# Patient Record
Sex: Female | Born: 1995 | Race: Black or African American | Hispanic: No | Marital: Single | Smoking: Current every day smoker
Health system: Southern US, Community
[De-identification: ages and names within clinical notes are randomized; demographics above are authoritative.]

---
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
## 2017-07-20 ENCOUNTER — Encounter (HOSPITAL_COMMUNITY): Payer: Self-pay | Admitting: Emergency Medicine

## 2017-07-20 ENCOUNTER — Other Ambulatory Visit: Payer: Self-pay

## 2017-07-20 ENCOUNTER — Emergency Department (HOSPITAL_COMMUNITY)
Admission: EM | Admit: 2017-07-20 | Discharge: 2017-07-20 | Disposition: A | Discharge status: Home / Self Care | Payer: Self-pay | Attending: Emergency Medicine | Admitting: Emergency Medicine

## 2017-07-20 DIAGNOSIS — R1032 Left lower quadrant pain: Secondary | ICD-10-CM | POA: Insufficient documentation

## 2017-07-20 DIAGNOSIS — Z5321 Procedure and treatment not carried out due to patient leaving prior to being seen by health care provider: Secondary | ICD-10-CM | POA: Insufficient documentation

## 2017-07-20 NOTE — ED Triage Notes (Signed)
Pt verbalizes "bump" and pain to left groin for past week; denies pain at present. Denies vaginal symptoms.

## 2017-07-23 NOTE — ED Notes (Signed)
07/23/2017, Attempted to do a follow-up call, no answer.

## 2017-08-08 ENCOUNTER — Emergency Department (HOSPITAL_COMMUNITY)
Admission: EM | Admit: 2017-08-08 | Discharge: 2017-08-08 | Disposition: A | Discharge status: Home / Self Care | Payer: Self-pay | Attending: Emergency Medicine | Admitting: Emergency Medicine

## 2017-08-08 ENCOUNTER — Encounter (HOSPITAL_COMMUNITY): Payer: Self-pay | Admitting: Emergency Medicine

## 2017-08-08 DIAGNOSIS — F17210 Nicotine dependence, cigarettes, uncomplicated: Secondary | ICD-10-CM | POA: Insufficient documentation

## 2017-08-08 DIAGNOSIS — R102 Pelvic and perineal pain unspecified side: Secondary | ICD-10-CM

## 2017-08-08 DIAGNOSIS — N898 Other specified noninflammatory disorders of vagina: Secondary | ICD-10-CM | POA: Insufficient documentation

## 2017-08-08 LAB — COMPREHENSIVE METABOLIC PANEL
ALT: 13 U/L — ABNORMAL LOW (ref 14–54)
ANION GAP: 7 (ref 5–15)
AST: 17 U/L (ref 15–41)
Albumin: 3.9 g/dL (ref 3.5–5.0)
Alkaline Phosphatase: 59 U/L (ref 38–126)
BUN: 13 mg/dL (ref 6–20)
CO2: 26 mmol/L (ref 22–32)
Calcium: 9.2 mg/dL (ref 8.9–10.3)
Chloride: 107 mmol/L (ref 101–111)
Creatinine, Ser: 0.79 mg/dL (ref 0.44–1.00)
GFR calc non Af Amer: >60 mL/min (ref >60)
GLUCOSE: 86 mg/dL (ref 65–99)
Potassium: 4.1 mmol/L (ref 3.5–5.1)
SODIUM: 140 mmol/L (ref 135–145)
TOTAL PROTEIN: 6.8 g/dL (ref 6.5–8.1)
Total Bilirubin: 0.6 mg/dL (ref 0.3–1.2)

## 2017-08-08 LAB — LIPASE, BLOOD: Lipase: 24 U/L (ref 11–51)

## 2017-08-08 LAB — WET PREP, GENITAL
Clue Cells Wet Prep HPF POC: NONE SEEN
SPERM: NONE SEEN
TRICH WET PREP: NONE SEEN
Yeast Wet Prep HPF POC: NONE SEEN

## 2017-08-08 LAB — URINALYSIS, ROUTINE W REFLEX MICROSCOPIC
BACTERIA UA: NONE SEEN
BILIRUBIN URINE: NEGATIVE
GLUCOSE, UA: NEGATIVE mg/dL
HGB URINE DIPSTICK: NEGATIVE
KETONES UR: 5 mg/dL — AB
NITRITE: NEGATIVE
PROTEIN: NEGATIVE mg/dL
Specific Gravity, Urine: 1.027 (ref 1.005–1.030)
pH: 6 (ref 5.0–8.0)

## 2017-08-08 LAB — CBC
HCT: 39.4 % (ref 36.0–46.0)
HEMOGLOBIN: 12.7 g/dL (ref 12.0–15.0)
MCH: 31.2 pg (ref 26.0–34.0)
MCHC: 32.2 g/dL (ref 30.0–36.0)
MCV: 96.8 fL (ref 78.0–100.0)
Platelets: 195 10*3/uL (ref 150–400)
RBC: 4.07 MIL/uL (ref 3.87–5.11)
RDW: 14.4 % (ref 11.5–15.5)
WBC: 6.3 10*3/uL (ref 4.0–10.5)

## 2017-08-08 LAB — I-STAT BETA HCG BLOOD, ED (MC, WL, AP ONLY): I-stat hCG, quantitative: <5 m[IU]/mL (ref <5)

## 2017-08-08 NOTE — ED Triage Notes (Signed)
Pt c/o lower abd pains for couple weeks that is intermittent. Denies n/v/d, urinary problems. Thinks has vaginal tear bc has pains for months. Denies any bleeding.

## 2017-08-08 NOTE — ED Notes (Signed)
Opened chart to reprint discharge instructions.

## 2017-08-08 NOTE — Discharge Instructions (Signed)
[facility] persist

## 2017-08-08 NOTE — ED Provider Notes (Signed)
[facility]-EMERGENCY DEPT Provider Note   CSN: [id number] Arrival date & time: 08/08/17  1842    History   Chief Complaint Chief Complaint  Patient presents with  . Abdominal Pain    HPI April Parker is a 22 y.o. female who presents with pelvic pain and vaginal pain.  She states that she has had this pain for several months.  She previously lived and [facility] and was diagnosed with genital warts and given antibiotics and a cream.  She states that the antibiotics made her symptoms worse.  She came tonight because she feels like she has a tear in the vaginal area.  She has not had any bleeding from the area.  She is currently sexually active with one partner and uses protection.  She has had a Pap smear in the past which was abnormal but she cannot recall the results.  She.denies prior abdominal surgeries or pelvic surgeries.  She is not on birth control.  She has had vaginal discharge which is brown in color.  She denies fever, chest pain, shortness of breath, abdominal pain, nausea, vomiting, diarrhea, urinary symptoms.  HPI  History reviewed. No pertinent past medical history.  There are no active problems to display for this patient.   History reviewed. No pertinent surgical history.   OB History   None      Home Medications    Prior to Admission medications   Not on File    Family History No family history on file.  Social History Social History   Tobacco Use  . Smoking status: Current Every Day Smoker    Types: Cigarettes  . Smokeless tobacco: Never Used  Substance Use Topics  . Alcohol use: Yes  . Drug use: Not on file     Allergies   Patient has no known allergies.   Review of Systems Review of Systems  Constitutional: Negative for fever.  Respiratory: Negative for shortness of breath.   Cardiovascular: Negative for chest pain.  Gastrointestinal: Negative for abdominal pain, diarrhea, nausea and vomiting.    Genitourinary: Positive for pelvic pain, vaginal discharge and vaginal pain. Negative for dysuria, flank pain, hematuria and vaginal bleeding.  All other systems reviewed and are negative.    Physical Exam Updated Vital Signs BP 112/63 (BP Location: Left Arm)   Pulse 99   Temp 98.1 F (36.7 C) (Oral)   Resp 18   LMP 07/02/2017   SpO2 100%   Physical Exam  Constitutional: She is oriented to person, place, and time. She appears well-developed and well-nourished. No distress.  HENT:  Head: Normocephalic and atraumatic.  Eyes: Pupils are equal, round, and reactive to light. Conjunctivae are normal. Right eye exhibits no discharge. Left eye exhibits no discharge. No scleral icterus.  Neck: Normal range of motion.  Cardiovascular: Normal rate and regular rhythm.  Pulmonary/Chest: Effort normal and breath sounds normal. No respiratory distress.  Abdominal: Soft. Bowel sounds are normal. She exhibits no distension. There is tenderness (suprapubic, right and left inguinal tenderness).  Genitourinary:  Genitourinary Comments: Pelvic: No inguinal lymphadenopathy or inguinal hernia noted. Normal external genitalia. No pain with speculum insertion. Closed cervical os with normal appearance - no rash or lesions. No significant discharge or bleeding noted from cervix or in vaginal vault. On bimanual examination no adnexal tenderness or cervical motion tenderness. Chaperone present during exam.    Neurological: She is alert and oriented to person, place, and time.  Skin: Skin is warm and dry.  Psychiatric:  She has a normal mood and affect. Her behavior is normal.  Nursing note and vitals reviewed.    ED Treatments / Results  Labs (all labs ordered are listed, but only abnormal results are displayed) Labs Reviewed  WET PREP, GENITAL - Abnormal; Notable for the following components:      Result Value   WBC, Wet Prep HPF POC FEW (*)    All other components within normal limits  COMPREHENSIVE  METABOLIC PANEL - Abnormal; Notable for the following components:   ALT 13 (*)    All other components within normal limits  URINALYSIS, ROUTINE W REFLEX MICROSCOPIC - Abnormal; Notable for the following components:   APPearance HAZY (*)    Ketones, ur 5 (*)    Leukocytes, UA SMALL (*)    Squamous Epithelial / LPF 0-5 (*)    All other components within normal limits  LIPASE, BLOOD  CBC  RPR  HIV ANTIBODY (ROUTINE TESTING)  I-STAT BETA HCG BLOOD, ED ([facility], [facility], [facility] ONLY)  GC/CHLAMYDIA PROBE AMP ([place]) NOT AT [facility]    EKG None  Radiology No results found.  Procedures Procedures (including critical care time)  Medications Ordered in ED Medications - No data to display   Initial Impression / Assessment and Plan / ED Course  I have reviewed the triage vital signs and the nursing notes.  Pertinent labs & imaging results that were available during my care of the patient were reviewed by me and considered in my medical decision making (see chart for details).  22 year old female presents with pelvic and vaginal pain.  Unclear etiology.  She seems to think it is due to an antibiotic that she had a couple months ago however I highly doubt this.  Her vital signs are normal.  She has mild pelvic tenderness.  Pelvic exam is unremarkable.  Her lab work is normal.  Her wet prep is normal.  Her urine does not appear infected.  She was advised to follow-up with OB/GYN if her symptoms persist.  Final Clinical Impressions(s) / ED Diagnoses   Final diagnoses:  Vaginal pain  Pelvic pain    ED Discharge Orders    None       Bethel BornGekas, Magdelyn Roebuck Marie, PA-C 08/08/17 2216    Linwood DibblesKnapp, Jon, MD 08/08/17 [phone]

## 2017-08-09 LAB — RPR: RPR: NONREACTIVE

## 2017-08-09 LAB — GC/CHLAMYDIA PROBE AMP (~~LOC~~) NOT AT ARMC
Chlamydia: NEGATIVE
Neisseria Gonorrhea: NEGATIVE

## 2017-08-09 LAB — HIV ANTIBODY (ROUTINE TESTING W REFLEX): HIV Screen 4th Generation wRfx: NONREACTIVE
